# Patient Record
Sex: Male | Born: 1965 | Race: White | Hispanic: No | Marital: Single | Smoking: Current some day smoker
Health system: Southern US, Community
[De-identification: ages and names within clinical notes are randomized; demographics above are authoritative.]

## PROBLEM LIST (undated history)

## (undated) HISTORY — PX: FRACTURE SURGERY: SHX138

---

## 1988-06-12 DIAGNOSIS — I671 Cerebral aneurysm, nonruptured: Secondary | ICD-10-CM | POA: Insufficient documentation

## 1988-06-12 HISTORY — DX: Cerebral aneurysm, nonruptured: I67.1

## 2019-06-18 ENCOUNTER — Encounter: Payer: Self-pay | Admitting: Nurse Practitioner

## 2019-06-18 ENCOUNTER — Emergency Department (HOSPITAL_COMMUNITY)
Admission: EM | Admit: 2019-06-18 | Discharge: 2019-06-18 | Disposition: A | Discharge status: Home / Self Care | Payer: Self-pay | Attending: Emergency Medicine | Admitting: Emergency Medicine

## 2019-06-18 ENCOUNTER — Other Ambulatory Visit: Payer: Self-pay

## 2019-06-18 ENCOUNTER — Telehealth: Payer: Self-pay | Admitting: Nurse Practitioner

## 2019-06-18 ENCOUNTER — Encounter (HOSPITAL_COMMUNITY): Payer: Self-pay | Admitting: Emergency Medicine

## 2019-06-18 ENCOUNTER — Ambulatory Visit (INDEPENDENT_AMBULATORY_CARE_PROVIDER_SITE_OTHER): Payer: Self-pay | Admitting: Nurse Practitioner

## 2019-06-18 ENCOUNTER — Emergency Department (HOSPITAL_COMMUNITY): Payer: Self-pay

## 2019-06-18 VITALS — BP 172/110 | HR 94 | Temp 98.8°F | Wt 120.2 lb

## 2019-06-18 DIAGNOSIS — Z7689 Persons encountering health services in other specified circumstances: Secondary | ICD-10-CM

## 2019-06-18 DIAGNOSIS — Z8679 Personal history of other diseases of the circulatory system: Secondary | ICD-10-CM | POA: Insufficient documentation

## 2019-06-18 DIAGNOSIS — I10 Essential (primary) hypertension: Secondary | ICD-10-CM

## 2019-06-18 DIAGNOSIS — H538 Other visual disturbances: Secondary | ICD-10-CM | POA: Insufficient documentation

## 2019-06-18 DIAGNOSIS — F17210 Nicotine dependence, cigarettes, uncomplicated: Secondary | ICD-10-CM | POA: Insufficient documentation

## 2019-06-18 DIAGNOSIS — Z1211 Encounter for screening for malignant neoplasm of colon: Secondary | ICD-10-CM

## 2019-06-18 DIAGNOSIS — Z1212 Encounter for screening for malignant neoplasm of rectum: Secondary | ICD-10-CM

## 2019-06-18 DIAGNOSIS — Z13220 Encounter for screening for lipoid disorders: Secondary | ICD-10-CM

## 2019-06-18 DIAGNOSIS — Z13228 Encounter for screening for other metabolic disorders: Secondary | ICD-10-CM

## 2019-06-18 DIAGNOSIS — Z125 Encounter for screening for malignant neoplasm of prostate: Secondary | ICD-10-CM

## 2019-06-18 DIAGNOSIS — I671 Cerebral aneurysm, nonruptured: Secondary | ICD-10-CM

## 2019-06-18 DIAGNOSIS — Z23 Encounter for immunization: Secondary | ICD-10-CM

## 2019-06-18 HISTORY — DX: Nicotine dependence, cigarettes, uncomplicated: F17.210

## 2019-06-18 LAB — BASIC METABOLIC PANEL
Anion gap: 10 (ref 5–15)
BUN: 12 mg/dL (ref 6–20)
CO2: 22 mmol/L (ref 22–32)
Calcium: 9.2 mg/dL (ref 8.9–10.3)
Chloride: 105 mmol/L (ref 98–111)
Creatinine, Ser: 0.91 mg/dL (ref 0.61–1.24)
GFR calc Af Amer: >60 mL/min (ref >60)
GFR calc non Af Amer: >60 mL/min (ref >60)
Glucose, Bld: 106 mg/dL — ABNORMAL HIGH (ref 70–99)
Potassium: 3.8 mmol/L (ref 3.5–5.1)
Sodium: 137 mmol/L (ref 135–145)

## 2019-06-18 LAB — CBC
HCT: 41.9 % (ref 39.0–52.0)
Hemoglobin: 13.8 g/dL (ref 13.0–17.0)
MCH: 30.1 pg (ref 26.0–34.0)
MCHC: 32.9 g/dL (ref 30.0–36.0)
MCV: 91.5 fL (ref 80.0–100.0)
Platelets: 249 10*3/uL (ref 150–400)
RBC: 4.58 MIL/uL (ref 4.22–5.81)
RDW: 13 % (ref 11.5–15.5)
WBC: 7.5 10*3/uL (ref 4.0–10.5)
nRBC: 0 % (ref 0.0–0.2)

## 2019-06-18 MED ORDER — LISINOPRIL-HYDROCHLOROTHIAZIDE 20-12.5 MG PO TABS: 1.0000 | ORAL_TABLET | Freq: Every day | ORAL | 3 refills | Status: DC

## 2019-06-18 MED ORDER — NICOTINE 14 MG/24HR TD PT24: 14.0000 mg | MEDICATED_PATCH | Freq: Every day | TRANSDERMAL | 0 refills | Status: DC

## 2019-06-18 NOTE — Telephone Encounter (Signed)
Called report over to [facility] ED informing that we were going to send patient over via personal vehicle for elevated BP that is non responsive to Bystolic 5 MG x 2 in our office. Also advised patient has hx of Brain aneurysm and is current smoker. Advised to send patient over and inform him that there will be a wait in the ED. PCP in office notified
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
## 2019-06-18 NOTE — ED Triage Notes (Addendum)
Patient states he was seen at for check-up today with PCP. States he has had high BP for past month and was seen for this today. Currently not on any BP medications but was prescribed some today (does not recall name of meds).   Does not recall what BP was at appointment today. Was told by PCP to come to ED to be seen since BP would not come down.

## 2019-06-18 NOTE — Discharge Instructions (Addendum)
You were evaluated in the Emergency Department and after careful evaluation, we did not find any emergent condition requiring admission or further testing in the hospital.  Your exam/testing today was overall reassuring.  Please continue follow-up with your primary care doctor and take the blood pressure medication as directed.  Please return to the Emergency Department if you experience any worsening of your condition.  We encourage you to follow up with a primary care provider.  Thank you for allowing [place] to be a part of your care.

## 2019-06-18 NOTE — Progress Notes (Signed)
New Patient Office Visit  Subjective:  Patient ID: Leonard Tran, male    DOB: January 18, 1966  Age: 54 y.o. MRN: [id number]  CC:  Chief Complaint  Patient presents with  . 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 He has not seen a doctor in over 10 years. He is not on or have been on any treatment for HTN He has smoked for at least 30 years 1 to 1.5 pack a day of cigarettes.   His health and medication history reviewed and discussed. Encouraged healthy lifestyle to include healthy eating and exercise. Dicussed labs for screening of Prostate, lipids, and metabolic. Pt has not completed cscope and desires after discussion of risk vs benefit.   Tdap desired and administered without reaction in 15 minutes.  Bystolic 5 mg administered in clinic two times (what is available in clinic) without reducing Blood pressure or heart rate. EKG completed NSR and advised pt to go to ER r/t elevated blood pressure, h/o brain aneurysm in a smoker. I did prescribed Lisinopril/hctz for pt to start however in lieu of resistance and stated ER is advised. [facility] LPN called report to ER. Pt reports uninsured and desires to drive. He is stable, a/ox4, no dizzyness, vertigo, cp/ct, visual changes. Advised to pull over and call 911 if sxs occur in route to ER.   Past Medical History:  Diagnosis Date  . Brain aneurysm 06/12/1988  . Cigarette smoker one half pack a day or less 06/18/2019   No family history on file.  Social History   Socioeconomic History  . Marital status: Single    Spouse name: Not on file  . Number of children: Not on file  . Years of education: Not on file  . Highest education level: Not on file  Occupational History  . Not on file  Tobacco Use  . Smoking status: Current Some Day Smoker  . Smokeless tobacco: Never  Used  Substance and Sexual Activity  . Alcohol use: Yes    Comment: twice a year  . Drug use: Never  . Sexual activity: Not Currently  Other Topics Concern  . Not on file  Social History Narrative  . Not on file   Social Determinants of Health   Financial Resource Strain:   . Difficulty of Paying Living Expenses:   Food Insecurity:   . Worried About Programme researcher, broadcasting/film/video in the Last Year:   . Barista in the Last Year:   Transportation Needs:   . Freight forwarder (Medical):   Marland Kitchen Lack of Transportation (Non-Medical):   Physical Activity:   . Days of Exercise per Week:   . Minutes of Exercise per Session:   Stress:   . Feeling of Stress :   Social Connections:   . Frequency of Communication with Friends and Family:   . Frequency of Social Gatherings with Friends and Family:   . Attends Religious Services:   . Active Member of Clubs or Organizations:   . Attends Banker Meetings:   Marland Kitchen Marital Status:   Intimate Partner Violence:   . Fear of Current or Ex-Partner:   . Emotionally Abused:   Marland Kitchen Physically Abused:   . Sexually Abused:     ROS Review of Systems  All other systems  reviewed and are negative.   Objective:   Today's Vitals: BP (!) 172/110 (BP Location: Left Arm, Patient Position: Sitting, Cuff Size: Normal)   Pulse 94   Temp 98.8 F (37.1 C) (Temporal)   Wt 120 lb 3.2 oz (54.5 kg)   SpO2 96%   Physical Exam Vitals and nursing note reviewed.  Constitutional:      Appearance: Normal appearance.  HENT:     Head: Normocephalic.     Right Ear: Tympanic membrane, ear canal and external ear normal.     Left Ear: Tympanic membrane, ear canal and external ear normal.     Nose: Nose normal.     Mouth/Throat:     Mouth: Mucous membranes are moist.     Pharynx: Oropharynx is clear.  Eyes:     Extraocular Movements: Extraocular movements intact.     Conjunctiva/sclera: Conjunctivae normal.     Pupils: Pupils are equal, round, and  reactive to light.  Neck:     Thyroid: No thyromegaly or thyroid tenderness.     Vascular: No carotid bruit or JVD.  Cardiovascular:     Rate and Rhythm: Normal rate and regular rhythm.     Pulses: Normal pulses.     Heart sounds: Normal heart sounds.  Pulmonary:     Effort: Pulmonary effort is normal.     Breath sounds: Normal breath sounds.  Abdominal:     General: There is no abdominal bruit.     Palpations: There is no hepatomegaly or splenomegaly.  Musculoskeletal:     Cervical back: Normal range of motion and neck supple.     Right lower leg: No edema.     Left lower leg: No edema.  Lymphadenopathy:     Cervical: No cervical adenopathy.  Skin:    General: Skin is warm and dry.     Capillary Refill: Capillary refill takes less than 2 seconds.  Neurological:     General: No focal deficit present.     Mental Status: He is alert and oriented to person, place, and time.  Psychiatric:        Attention and Perception: Attention normal.        Mood and Affect: Mood normal.        Speech: Speech normal.        Behavior: Behavior normal. Behavior is cooperative.        Cognition and Memory: Cognition normal.        Judgment: Judgment normal.     Assessment & Plan:  You have established care Your blood pressure is elevated: discussed risk and benefits of treatment, prescribed medication, start taking today and follow up in 1 week. Take blood pressure at home 3 hours after AM dose of medication and bring to your next appointment. You where given Tdap vaccine today  Print out information/education discussed and provided for Tdap, healthy exercise and healthy diet, hypertension, health risks of smoking, and colorectal screening Labs today for PSA, CBC, CMP, Lipid the results will be called to you with instructions and follow up plan  based on results GI referreral for colonoscopy-colorectal cancer screen Smoking cessation: nicotine patches every 24 hours, do not smoke while using  patch. We will plan to reduce the patch dose on next refill.  Go to ER for elevated blood pressure.He is stable, a/ox4, no dizzyness, vertigo, cp/ct, visual changes. Advised to pull over and call 911 if sxs occur in route to ER.   Follow up in 1 week for HTN  blood pressure recheck.     Outpatient Encounter Medications as of 06/18/2019  Medication Sig  . ibuprofen (ADVIL) 100 MG tablet Take 100 mg by mouth every 6 (six) hours as needed for fever.  Marland Kitchen lisinopril-hydrochlorothiazide (ZESTORETIC) 20-12.5 MG tablet Take 1 tablet by mouth daily.  . nicotine (NICODERM CQ - DOSED IN MG/24 HOURS) 14 mg/24hr patch Place 1 patch (14 mg total) onto the skin daily.   No facility-administered encounter medications on file as of 06/18/2019.    Follow-up: Return in about 1 day (around 06/19/2019).   Elmore Guise, FNP

## 2019-06-23 ENCOUNTER — Ambulatory Visit (INDEPENDENT_AMBULATORY_CARE_PROVIDER_SITE_OTHER): Payer: Self-pay | Admitting: Nurse Practitioner

## 2019-06-23 ENCOUNTER — Encounter: Payer: Self-pay | Admitting: Nurse Practitioner

## 2019-06-23 ENCOUNTER — Other Ambulatory Visit: Payer: Self-pay

## 2019-06-23 VITALS — BP 140/98 | HR 88 | Temp 98.0°F | Resp 16 | Ht 72.0 in | Wt 118.5 lb

## 2019-06-23 DIAGNOSIS — Z09 Encounter for follow-up examination after completed treatment for conditions other than malignant neoplasm: Secondary | ICD-10-CM

## 2019-06-23 DIAGNOSIS — Z1212 Encounter for screening for malignant neoplasm of rectum: Secondary | ICD-10-CM

## 2019-06-23 DIAGNOSIS — Z1211 Encounter for screening for malignant neoplasm of colon: Secondary | ICD-10-CM

## 2019-06-23 DIAGNOSIS — F17200 Nicotine dependence, unspecified, uncomplicated: Secondary | ICD-10-CM

## 2019-06-23 DIAGNOSIS — F17209 Nicotine dependence, unspecified, with unspecified nicotine-induced disorders: Secondary | ICD-10-CM

## 2019-06-23 DIAGNOSIS — Z720 Tobacco use: Secondary | ICD-10-CM

## 2019-06-23 DIAGNOSIS — Z13220 Encounter for screening for lipoid disorders: Secondary | ICD-10-CM

## 2019-06-23 DIAGNOSIS — Z716 Tobacco abuse counseling: Secondary | ICD-10-CM

## 2019-06-23 DIAGNOSIS — R7309 Other abnormal glucose: Secondary | ICD-10-CM

## 2019-06-23 DIAGNOSIS — Z0001 Encounter for general adult medical examination with abnormal findings: Secondary | ICD-10-CM

## 2019-06-23 DIAGNOSIS — I10 Essential (primary) hypertension: Secondary | ICD-10-CM

## 2019-06-23 DIAGNOSIS — Z125 Encounter for screening for malignant neoplasm of prostate: Secondary | ICD-10-CM

## 2019-06-23 NOTE — Patient Instructions (Signed)
Monitor home Blood Pressure Make appt with PCP for Blood Pressure readings 3 hours after taking medication if greater than 140/90. Start nicotine patches to stop smoking Exercise and eat low cholesterol, low carb, low sugar, low fat diet Follow up in 6 months Labs today nursing will call with report and directions

## 2019-06-23 NOTE — Progress Notes (Signed)
Established Patient Office Visit  Subjective:  Patient ID: Leonard Tran, male    DOB: [birth year]  Age: 54 y.o. MRN: [id number]  CC:  Chief Complaint  Patient presents with  . Hypertension    HPI Leonard Tran is a 54 year old male presenting for ED follow up for HTN. He had established care last week with this pclinic and was found to have uncontrolled HTN, smoker, and hx of brain aneurysm. HTN medication prescriptions where sent to his pharmacy that day. Pt has started taking his prescribed HTN medication. He denied cp/ct, gu/gi sxs, pain, edema, sob, or recent falls.   Took RX'd blood pressure medication 1 hour prior to the blood pressure reading. Education provided/discussed and printed for home blood pressure readings 3 hours after medication and goal readings <140/90.   Time spent counseling on smoking cessation, already prescribed patch and pt compelled and plans to start. He will follow up when his script runs out to consider and plan to reduce dose of nicotine.   CBC and CMP from ER unremarkable except elevated confirmed with pt fasting glucose. A1C Lipid panel and PSA to be drawn today and called by nsg with results and directions.   Past Medical History:  Diagnosis Date  . Brain aneurysm 06/12/1988  . Cigarette smoker one half pack a day or less 06/18/2019    No past surgical history on file.  No family history on file.  Social History   Socioeconomic History  . Marital status: Single    Spouse name: Not on file  . Number of children: Not on file  . Years of education: Not on file  . Highest education level: Not on file  Occupational History  . Not on file  Tobacco Use  . Smoking status: Current Some Day Smoker  . Smokeless tobacco: Never Used  Substance and Sexual Activity  . Alcohol use: Yes    Comment: twice a year  . Drug use: Never  . Sexual activity: Not Currently  Other Topics Concern  . Not on file  Social History Narrative  . Not on file   Social  Determinants of Health   Financial Resource Strain:   . Difficulty of Paying Living Expenses:   Food Insecurity:   . Worried About Charity fundraiser in the Last Year:   . Arboriculturist in the Last Year:   Transportation Needs:   . Film/video editor (Medical):   Marland Kitchen Lack of Transportation (Non-Medical):   Physical Activity:   . Days of Exercise per Week:   . Minutes of Exercise per Session:   Stress:   . Feeling of Stress :   Social Connections:   . Frequency of Communication with Friends and Family:   . Frequency of Social Gatherings with Friends and Family:   . Attends Religious Services:   . Active Member of Clubs or Organizations:   . Attends Archivist Meetings:   Marland Kitchen Marital Status:   Intimate Partner Violence:   . Fear of Current or Ex-Partner:   . Emotionally Abused:   Marland Kitchen Physically Abused:   . Sexually Abused:     Outpatient Medications Prior to Visit  Medication Sig Dispense Refill  . ibuprofen (ADVIL) 100 MG tablet Take 100 mg by mouth every 6 (six) hours as needed for fever.    Marland Kitchen lisinopril-hydrochlorothiazide (ZESTORETIC) 20-12.5 MG tablet Take 1 tablet by mouth daily. 90 tablet 3  . nicotine (NICODERM CQ - DOSED IN MG/24 HOURS)  14 mg/24hr patch Place 1 patch (14 mg total) onto the skin daily. (Patient not taking: Reported on 06/23/2019) 30 patch 0   No facility-administered medications prior to visit.    Allergies  Allergen Reactions  . Chantix [Varenicline] Rash    ROS Review of Systems  All other systems reviewed and are negative.     Objective:    Physical Exam  Constitutional: He is oriented to person, place, and time. Vital signs are normal. He appears well-developed and well-nourished. He does not appear ill.  HENT:  Head: Normocephalic.  Eyes: Pupils are equal, round, and reactive to light. Conjunctivae and EOM are normal.  Neck: No JVD present. Carotid bruit is not present. No thyromegaly present.  Cardiovascular: Normal rate,  regular rhythm and normal heart sounds.  Pulmonary/Chest: Effort normal and breath sounds normal.  Abdominal: Soft. Bowel sounds are normal. He exhibits no abdominal bruit.  Musculoskeletal:        General: Normal range of motion.     Cervical back: Normal range of motion and neck supple.  Lymphadenopathy:    He has no cervical adenopathy.  Neurological: He is alert and oriented to person, place, and time. He has normal reflexes.  Skin: Skin is warm and dry.  Psychiatric: He has a normal mood and affect. His behavior is normal. Judgment and thought content normal.  Nursing note and vitals reviewed.   BP (!) 140/98   Pulse 88   Temp 98 F (36.7 C) (Oral)   Resp 16   Ht 6' (1.829 m)   Wt 118 lb 8 oz (53.8 kg)   SpO2 98%   BMI 16.07 kg/m  Wt Readings from Last 3 Encounters:  06/23/19 118 lb 8 oz (53.8 kg)  06/18/19 120 lb (54.4 kg)  06/18/19 120 lb 3.2 oz (54.5 kg)     Health Maintenance Due  Topic Date Due  . HIV Screening  Never done  . COLONOSCOPY  Never done    There are no preventive care reminders to display for this patient.  No results found for: TSH Lab Results  Component Value Date   WBC 7.5 06/18/2019   HGB 13.8 06/18/2019   HCT 41.9 06/18/2019   MCV 91.5 06/18/2019   PLT 249 06/18/2019   Lab Results  Component Value Date   NA 137 06/18/2019   K 3.8 06/18/2019   CO2 22 06/18/2019   GLUCOSE 106 (H) 06/18/2019   BUN 12 06/18/2019   CREATININE 0.91 06/18/2019   CALCIUM 9.2 06/18/2019   ANIONGAP 10 06/18/2019     Assessment & Plan:  Follow Up today after sent to ER for HTN. Stable BP Continue to take prescribed medication taking home blood pressure and reporting/seeking medical attention for >140/90.  Education al print out for smoking cessation, Blood Pressure management, Healthy eating and exercise.  Lab results to be called to pt by nsg with follow up plan and directions related to lipid screen, a1c after ER elevated fasting glucose, and PSA  screen.  Cologuard ordered for colorectal screen, pt is asymptomatic.   Problem List Items Addressed This Visit    None    Visit Diagnoses    Hypertension, unspecified type    -  Primary   Follow-up exam after treatment       Lipid screening       Relevant Orders   Lipid panel   Prostate cancer screening       Relevant Orders   PSA   Elevated glucose  Relevant Orders   Hemoglobin A1c   Tobacco use disorder, continuous       Tobacco abuse counseling       Smoking addiction       Relevant Orders   Ambulatory referral to Smoking Cessation Program   Encounter for smoking cessation counseling       Tobacco abuse          Follow-up: Return in about 6 months (around 12/23/2019) for labs one week prior to apt.    Elmore Guise, FNP

## 2019-06-24 LAB — HEMOGLOBIN A1C
Hgb A1c MFr Bld: 5.4 % of total Hgb (ref <5.7)
Mean Plasma Glucose: 108 (calc)
eAG (mmol/L): 6 (calc)

## 2019-06-24 LAB — LIPID PANEL
Cholesterol: 182 mg/dL (ref <200)
HDL: 44 mg/dL (ref >=40)
LDL Cholesterol (Calc): 122 mg/dL (calc) — ABNORMAL HIGH
Non-HDL Cholesterol (Calc): 138 mg/dL (calc) — ABNORMAL HIGH (ref <130)
Total CHOL/HDL Ratio: 4.1 (calc) (ref <5.0)
Triglycerides: 66 mg/dL (ref <150)

## 2019-06-24 LAB — PSA: PSA: 3 ng/mL (ref <=4.0)

## 2019-07-09 ENCOUNTER — Encounter: Payer: Self-pay | Admitting: Nurse Practitioner

## 2019-07-09 ENCOUNTER — Other Ambulatory Visit: Payer: Self-pay | Admitting: Nurse Practitioner

## 2019-07-09 ENCOUNTER — Telehealth: Payer: Self-pay | Admitting: Nurse Practitioner

## 2019-07-09 DIAGNOSIS — I10 Essential (primary) hypertension: Secondary | ICD-10-CM

## 2019-07-09 DIAGNOSIS — R7309 Other abnormal glucose: Secondary | ICD-10-CM | POA: Insufficient documentation

## 2019-07-09 HISTORY — DX: Essential (primary) hypertension: I10

## 2019-07-09 NOTE — Telephone Encounter (Signed)
[facility] 

## 2019-07-09 NOTE — Telephone Encounter (Signed)
[phone] or [phone] Crystal put patient on bp medication now is making him dizzy and like he is going to fall out

## 2019-07-09 NOTE — Telephone Encounter (Signed)
See note

## 2019-07-09 NOTE — Telephone Encounter (Signed)
Please have him make an appointment and bring blood pressure readings from home.

## 2019-07-14 ENCOUNTER — Ambulatory Visit: Payer: Self-pay | Admitting: Nurse Practitioner

## 2019-08-19 ENCOUNTER — Encounter: Payer: Self-pay | Admitting: Nurse Practitioner

## 2019-12-23 ENCOUNTER — Ambulatory Visit: Payer: Self-pay | Admitting: Nurse Practitioner

## 2020-06-17 ENCOUNTER — Other Ambulatory Visit: Payer: Self-pay | Admitting: *Deleted

## 2020-07-14 ENCOUNTER — Other Ambulatory Visit: Payer: Self-pay

## 2020-07-14 DIAGNOSIS — Z125 Encounter for screening for malignant neoplasm of prostate: Secondary | ICD-10-CM

## 2020-07-14 DIAGNOSIS — I10 Essential (primary) hypertension: Secondary | ICD-10-CM

## 2020-07-14 MED ORDER — LISINOPRIL-HYDROCHLOROTHIAZIDE 20-12.5 MG PO TABS: 1.0000 | ORAL_TABLET | Freq: Every day | ORAL | 0 refills | Status: DC

## 2020-07-22 ENCOUNTER — Encounter: Payer: Self-pay | Admitting: Family Medicine

## 2020-07-22 ENCOUNTER — Other Ambulatory Visit: Payer: Self-pay

## 2020-07-22 ENCOUNTER — Ambulatory Visit (INDEPENDENT_AMBULATORY_CARE_PROVIDER_SITE_OTHER): Payer: Self-pay | Admitting: Family Medicine

## 2020-07-22 VITALS — BP 148/86 | HR 82 | Temp 98.2°F | Resp 14 | Ht 72.0 in | Wt 113.0 lb

## 2020-07-22 DIAGNOSIS — I671 Cerebral aneurysm, nonruptured: Secondary | ICD-10-CM

## 2020-07-22 DIAGNOSIS — I10 Essential (primary) hypertension: Secondary | ICD-10-CM

## 2020-07-22 DIAGNOSIS — Z720 Tobacco use: Secondary | ICD-10-CM

## 2020-07-22 MED ORDER — LOSARTAN POTASSIUM-HCTZ 50-12.5 MG PO TABS: 1.0000 | ORAL_TABLET | Freq: Every day | ORAL | 5 refills | Status: DC
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
## 2020-07-23 LAB — BASIC METABOLIC PANEL WITH GFR
BUN: 14 mg/dL (ref 7–25)
CO2: 28 mmol/L (ref 20–32)
Calcium: 9.5 mg/dL (ref 8.6–10.3)
Chloride: 101 mmol/L (ref 98–110)
Creat: 0.91 mg/dL (ref 0.70–1.33)
GFR, Est African American: 110 mL/min/{1.73_m2} (ref >=60)
GFR, Est Non African American: 95 mL/min/{1.73_m2} (ref >=60)
Glucose, Bld: 90 mg/dL (ref 65–99)
Potassium: 3.8 mmol/L (ref 3.5–5.3)
Sodium: 136 mmol/L (ref 135–146)

## 2020-08-11 ENCOUNTER — Other Ambulatory Visit: Payer: Self-pay | Admitting: Family Medicine

## 2020-08-11 DIAGNOSIS — I10 Essential (primary) hypertension: Secondary | ICD-10-CM

## 2020-09-03 ENCOUNTER — Other Ambulatory Visit: Payer: Self-pay | Admitting: *Deleted

## 2020-09-03 DIAGNOSIS — I10 Essential (primary) hypertension: Secondary | ICD-10-CM

## 2020-09-03 MED ORDER — LISINOPRIL-HYDROCHLOROTHIAZIDE 20-12.5 MG PO TABS: ORAL_TABLET | ORAL | 1 refills | Status: DC

## 2021-01-31 ENCOUNTER — Other Ambulatory Visit: Payer: Self-pay | Admitting: Family Medicine

## 2021-08-17 ENCOUNTER — Other Ambulatory Visit: Payer: Self-pay | Admitting: Family Medicine

## 2021-08-17 NOTE — Telephone Encounter (Signed)
Requested medication (s) are due for refill today: yes  Requested medication (s) are on the active medication list:yes  Last refill:  01/31/21  Future visit scheduled: no  Notes to clinic:  Unable to refill per protocol, appointment needed.  Called pt, no answer. Left VM to call back to schedule OV for medication refills.     Requested Prescriptions  Pending Prescriptions Disp Refills   losartan-hydrochlorothiazide (HYZAAR) 50-12.5 MG tablet [Pharmacy Med Name: LOSARTAN-HCTZ 50-12.5 MG TAB] 90 tablet 1    Sig: TAKE 1 TABLET BY MOUTH EVERY DAY     Cardiovascular: ARB + Diuretic Combos Failed - 08/17/2021  3:33 AM      Failed - K in normal range and within 180 days    Potassium  Date Value Ref Range Status  07/22/2020 3.8 3.5 - 5.3 mmol/L Final         Failed - Na in normal range and within 180 days    Sodium  Date Value Ref Range Status  07/22/2020 136 135 - 146 mmol/L Final         Failed - Cr in normal range and within 180 days    Creat  Date Value Ref Range Status  07/22/2020 0.91 0.70 - 1.33 mg/dL Final    Comment:    For patients >49 years of age, the reference limit for Creatinine is approximately 13% higher for people identified as African-American. .          Failed - eGFR is 10 or above and within 180 days    GFR, Est African American  Date Value Ref Range Status  07/22/2020 110 > OR = 60 mL/min/1.73m2 Final   GFR, Est Non African American  Date Value Ref Range Status  07/22/2020 95 > OR = 60 mL/min/1.73m2 Final         Failed - Last BP in normal range    BP Readings from Last 1 Encounters:  07/22/20 (!) 148/86         Failed - Valid encounter within last 6 months    Recent Outpatient Visits           1 year ago Tobacco abuse   [facility] Pickard, Warren T, MD   2 years ago Hypertension, unspecified type   [facility] Bates, Crystal A, FNP   2 years ago Screening, lipid   [facility] Bates,  Crystal A, FNP               Passed - Patient is not pregnant        

## 2021-09-11 ENCOUNTER — Other Ambulatory Visit: Payer: Self-pay | Admitting: Family Medicine

## 2021-09-12 NOTE — Telephone Encounter (Signed)
Called pt - LMOM to return call for OV.

## 2021-09-12 NOTE — Telephone Encounter (Signed)
Requested medications are due for refill today.  yes  Requested medications are on the active medications list.  yes  Last refill. 08/17/2021 #30   Future visit scheduled.   no  Notes to clinic.  Pt is more than 3 months overdue for OV.    Requested Prescriptions  Pending Prescriptions Disp Refills   losartan-hydrochlorothiazide (HYZAAR) 50-12.5 MG tablet [Pharmacy Med Name: LOSARTAN-HCTZ 50-12.5 MG TAB] 30 tablet 0    Sig: TAKE 1 TABLET BY MOUTH EVERY DAY     Cardiovascular: ARB + Diuretic Combos Failed - 09/11/2021  1:31 PM      Failed - K in normal range and within 180 days    Potassium  Date Value Ref Range Status  07/22/2020 3.8 3.5 - 5.3 mmol/L Final         Failed - Na in normal range and within 180 days    Sodium  Date Value Ref Range Status  07/22/2020 136 135 - 146 mmol/L Final         Failed - Cr in normal range and within 180 days    Creat  Date Value Ref Range Status  07/22/2020 0.91 0.70 - 1.33 mg/dL Final    Comment:    For patients >49 years of age, the reference limit for Creatinine is approximately 13% higher for people identified as African-American. .          Failed - eGFR is 10 or above and within 180 days    GFR, Est African American  Date Value Ref Range Status  07/22/2020 110 > OR = 60 mL/min/1.73m2 Final   GFR, Est Non African American  Date Value Ref Range Status  07/22/2020 95 > OR = 60 mL/min/1.73m2 Final         Failed - Last BP in normal range    BP Readings from Last 1 Encounters:  07/22/20 (!) 148/86         Failed - Valid encounter within last 6 months    Recent Outpatient Visits           1 year ago Tobacco abuse   [facility] Pickard, Warren T, MD   2 years ago Hypertension, unspecified type   [facility] Bates, Crystal A, FNP   2 years ago Screening, lipid   [facility] Bates, Crystal A, FNP              Passed - Patient is not pregnant        

## 2021-11-29 ENCOUNTER — Ambulatory Visit (INDEPENDENT_AMBULATORY_CARE_PROVIDER_SITE_OTHER): Payer: Self-pay | Admitting: Family Medicine

## 2021-11-29 VITALS — BP 160/110 | HR 88 | Temp 97.8°F | Wt 116.2 lb

## 2021-11-29 DIAGNOSIS — R634 Abnormal weight loss: Secondary | ICD-10-CM

## 2021-11-29 NOTE — Progress Notes (Signed)
Subjective:    Patient ID: Leonard Mons., male    DOB: [birth year], 56 y.o.   MRN: [id number]  HPI  Patient is a 56 year old Caucasian gentleman with a history of hypertension who presents today with unintentional weight loss. Wt Readings from Last 3 Encounters:  11/29/21 116 lb 3.2 oz (52.7 kg)  07/22/20 113 lb (51.3 kg)  06/23/19 118 lb 8 oz (53.8 kg)   Patient states that he has lost more than 10 pounds.  By my scales he is actually 3 pounds heavier than he was last year.  He denies any chest pain or shortness of breath hemoptysis.  He does have a 20+ pack year history of smoking.  He denies any abdominal pain nausea vomiting melena hematochezia.  He is overdue for colon cancer screening.  He recently had a DOT physical and the doctor was concerned about a dysplastic mole.  On his back there is a mole roughly 5 mm in diameter.  It is well-circumscribed.  Its monotone in color.  It is dark brown.  We discussed a biopsy but the patient declined that today.  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  Past Medical History:  Diagnosis Date   Brain aneurysm 06/12/1988   Cigarette smoker one half pack a day or less 06/18/2019   HTN (hypertension) 07/09/2019   Past Surgical History:  Procedure Laterality Date   FRACTURE SURGERY Right    arm   Current Outpatient Medications on File Prior to Visit  Medication Sig Dispense Refill   ibuprofen (ADVIL) 100 MG tablet Take 100 mg by mouth every 6 (six) hours as needed for fever.     losartan-hydrochlorothiazide (HYZAAR) 50-12.5 MG tablet TAKE 1 TABLET BY MOUTH EVERY DAY 30 tablet 0   No current facility-administered medications on file prior to visit.   Allergies  Allergen Reactions   Chantix [Varenicline] Rash   Social History   Socioeconomic History   Marital status: Single    Spouse name: Not on file   Number of children: 1   Years of education: Not on file   Highest education level: Not on file   Occupational History   Occupation: Cabin crew  Tobacco Use   Smoking status: Some Days    Packs/day: 1.50    Years: 35.00    Total pack years: 52.50    Types: Cigarettes   Smokeless tobacco: Never  Vaping Use   Vaping Use: Never used  Substance and Sexual Activity   Alcohol use: Yes    Comment: twice a year   Drug use: Yes    Types: Marijuana   Sexual activity: Not Currently  Other Topics Concern   Not on file  Social History Narrative   Not on file   Social Determinants of Health   Financial Resource Strain: Not on file  Food Insecurity: Not on file  Transportation Needs: Not on file  Physical Activity: Not on file  Stress: Not on file  Social Connections: Not on file  Intimate Partner Violence: Not on file   Family History  Problem Relation Age of Onset   Hypertension Father    Alcohol abuse Maternal Grandmother    Alcohol abuse Maternal Grandfather    ALS Paternal Grandmother    Alcohol abuse Paternal Grandfather    Cancer Paternal Grandfather        esophagus   Hearing loss Paternal Grandfather      Review of Systems  All other  systems reviewed and are negative.      Objective:   Physical Exam Vitals reviewed.  Constitutional:      General: He is not in acute distress.    Appearance: Normal appearance. He is normal weight. He is not ill-appearing, toxic-appearing or diaphoretic.  HENT:     Head: Normocephalic and atraumatic.     Right Ear: Tympanic membrane and ear canal normal. There is no impacted cerumen.     Left Ear: Tympanic membrane and ear canal normal. There is no impacted cerumen.     Nose: Nose normal. No congestion or rhinorrhea.     Mouth/Throat:     Mouth: Mucous membranes are moist.     Pharynx: Oropharynx is clear. No oropharyngeal exudate or posterior oropharyngeal erythema.  Eyes:     General: No scleral icterus.       Right eye: No discharge.        Left eye: No discharge.     Extraocular Movements: Extraocular movements  intact.     Conjunctiva/sclera: Conjunctivae normal.     Pupils: Pupils are equal, round, and reactive to light.  Neck:     Vascular: No carotid bruit.  Cardiovascular:     Rate and Rhythm: Normal rate and regular rhythm.     Pulses: Normal pulses.     Heart sounds: Normal heart sounds. No murmur heard.    No friction rub. No gallop.  Pulmonary:     Effort: Pulmonary effort is normal. No respiratory distress.     Breath sounds: Normal breath sounds. No wheezing, rhonchi or rales.  Chest:     Chest wall: No tenderness.  Abdominal:     General: Abdomen is flat. Bowel sounds are normal. There is no distension.     Palpations: Abdomen is soft. There is no mass.     Tenderness: There is no abdominal tenderness. There is no guarding or rebound.     Hernia: No hernia is present.  Musculoskeletal:     Cervical back: Normal range of motion and neck supple. No rigidity.     Right lower leg: No edema.     Left lower leg: No edema.  Lymphadenopathy:     Cervical: No cervical adenopathy.  Skin:    General: Skin is warm.     Coloration: Skin is not jaundiced or pale.     Findings: No bruising, erythema, lesion or rash.  Neurological:     General: No focal deficit present.     Mental Status: He is alert and oriented to person, place, and time. Mental status is at baseline.     Cranial Nerves: No cranial nerve deficit.     Sensory: No sensory deficit.     Motor: No weakness.     Coordination: Coordination normal.     Gait: Gait normal.     Deep Tendon Reflexes: Reflexes normal.  Psychiatric:        Mood and Affect: Mood normal.        Behavior: Behavior normal.        Thought Content: Thought content normal.        Judgment: Judgment normal.           Assessment & Plan:  Weight loss - Plan: CBC with Differential/Platelet, Lipid panel, COMPLETE METABOLIC PANEL WITH GFR, PSA, TSH Given the weight loss, I will check a CBC and CMP lipid panel PSA and TSH.  If labs are normal, I would  attribute any weight loss most likely due  to sweating working in the heat and the patient also admits that he has been eating less recently.  If the labs are abnormal, I would like to pursue that further.  I did recommend a CT scan of the lungs and a colonoscopy for cancer screening that is overdue but the patient defers these at the present time due to cost.  His blood pressure here today is elevated however he has whitecoat syndrome.  His blood pressure at home is typically 120/70 and he monitors this on a daily basis.

## 2021-11-30 LAB — PSA: PSA: 1.31 ng/mL (ref <=4.00)

## 2021-11-30 LAB — CBC WITH DIFFERENTIAL/PLATELET
Absolute Monocytes: 905 cells/uL (ref 200–950)
Basophils Absolute: 86 cells/uL (ref 0–200)
Basophils Relative: 1.1 %
Eosinophils Absolute: 86 cells/uL (ref 15–500)
Eosinophils Relative: 1.1 %
HCT: 45 % (ref 38.5–50.0)
Hemoglobin: 15 g/dL (ref 13.2–17.1)
Lymphs Abs: 2090 cells/uL (ref 850–3900)
MCH: 31.3 pg (ref 27.0–33.0)
MCHC: 33.3 g/dL (ref 32.0–36.0)
MCV: 93.9 fL (ref 80.0–100.0)
MPV: 10.2 fL (ref 7.5–12.5)
Monocytes Relative: 11.6 %
Neutro Abs: 4633 cells/uL (ref 1500–7800)
Neutrophils Relative %: 59.4 %
Platelets: 234 10*3/uL (ref 140–400)
RBC: 4.79 10*6/uL (ref 4.20–5.80)
RDW: 12.8 % (ref 11.0–15.0)
Total Lymphocyte: 26.8 %
WBC: 7.8 10*3/uL (ref 3.8–10.8)

## 2021-11-30 LAB — COMPLETE METABOLIC PANEL WITH GFR
AG Ratio: 1 (calc) (ref 1.0–2.5)
ALT: 19 U/L (ref 9–46)
AST: 19 U/L (ref 10–35)
Albumin: 4.1 g/dL (ref 3.6–5.1)
Alkaline phosphatase (APISO): 71 U/L (ref 35–144)
BUN: 12 mg/dL (ref 7–25)
CO2: 29 mmol/L (ref 20–32)
Calcium: 9.9 mg/dL (ref 8.6–10.3)
Chloride: 105 mmol/L (ref 98–110)
Creat: 0.98 mg/dL (ref 0.70–1.30)
Globulin: 4.1 g/dL (calc) — ABNORMAL HIGH (ref 1.9–3.7)
Glucose, Bld: 60 mg/dL — ABNORMAL LOW (ref 65–99)
Potassium: 4 mmol/L (ref 3.5–5.3)
Sodium: 141 mmol/L (ref 135–146)
Total Bilirubin: 0.4 mg/dL (ref 0.2–1.2)
Total Protein: 8.2 g/dL — ABNORMAL HIGH (ref 6.1–8.1)
eGFR: 91 mL/min/{1.73_m2} (ref >=60)

## 2021-11-30 LAB — LIPID PANEL
Cholesterol: 186 mg/dL (ref <200)
HDL: 48 mg/dL (ref >=40)
LDL Cholesterol (Calc): 121 mg/dL (calc) — ABNORMAL HIGH
Non-HDL Cholesterol (Calc): 138 mg/dL (calc) — ABNORMAL HIGH (ref <130)
Total CHOL/HDL Ratio: 3.9 (calc) (ref <5.0)
Triglycerides: 77 mg/dL (ref <150)

## 2021-11-30 LAB — TSH: TSH: 0.82 mIU/L (ref 0.40–4.50)

## 2021-12-01 ENCOUNTER — Other Ambulatory Visit: Payer: Self-pay | Admitting: Family Medicine

## 2021-12-25 ENCOUNTER — Other Ambulatory Visit: Payer: Self-pay | Admitting: Family Medicine

## 2021-12-26 NOTE — Telephone Encounter (Signed)
Requested Prescriptions  Pending Prescriptions Disp Refills  . losartan-hydrochlorothiazide (HYZAAR) 50-12.5 MG tablet [Pharmacy Med Name: LOSARTAN-HCTZ 50-12.5 MG TAB] 90 tablet 1    Sig: TAKE 1 TABLET BY MOUTH EVERY DAY     Cardiovascular: ARB + Diuretic Combos Failed - 12/25/2021  1:33 PM      Failed - Last BP in normal range    BP Readings from Last 1 Encounters:  11/29/21 (!) 160/110         Failed - Valid encounter within last 6 months    Recent Outpatient Visits          1 year ago Tobacco abuse   [place] Susy Frizzle, MD   2 years ago Hypertension, unspecified type   [facility] [place], [place]   2 years ago Screening, lipid   [place] [place], [place]      Future Appointments            In 5 months Pickard, Cammie Mcgee, MD [place]           Passed - K in normal range and within 180 days    Potassium  Date Value Ref Range Status  11/29/2021 4.0 3.5 - 5.3 mmol/L Final         Passed - Na in normal range and within 180 days    Sodium  Date Value Ref Range Status  11/29/2021 141 135 - 146 mmol/L Final         Passed - Cr in normal range and within 180 days    Creat  Date Value Ref Range Status  11/29/2021 0.98 0.70 - 1.30 mg/dL Final         Passed - eGFR is 10 or above and within 180 days    GFR, Est African American  Date Value Ref Range Status  07/22/2020 110 > OR = 60 mL/min/1.58m Final   GFR, Est Non African American  Date Value Ref Range Status  07/22/2020 95 > OR = 60 mL/min/1.722mFinal   eGFR  Date Value Ref Range Status  11/29/2021 91 > OR = 60 mL/min/1.7368minal         Passed - Patient is not pregnant

## 2022-01-11 IMAGING — CT CT HEAD W/O CM
3 series · 16 of 47 positions shown, 19 images · non-contrast
Comparison: None.

CLINICAL DATA: HTN, headache, aneurysm repair.

EXAM:
CT HEAD WITHOUT CONTRAST
TECHNIQUE: Contiguous axial images were obtained from the base of the skull
through the vertex without intravenous contrast.

[Series 2: head wo · axial · 0.41mm/px · z∈[+1283,+1413]mm · 10 of 32 slices shown, 13 images]
[im 3/32  brain]
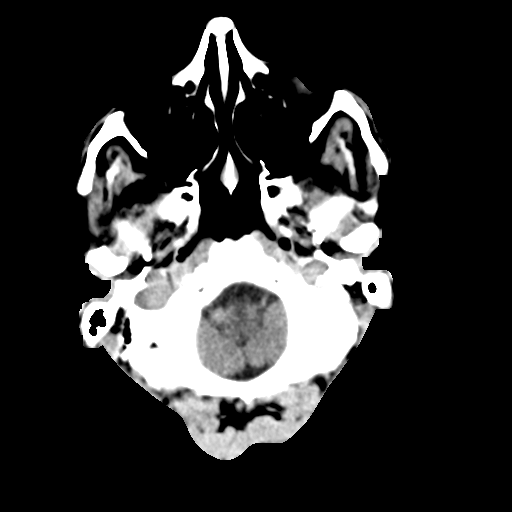
[im 3/32  bone]
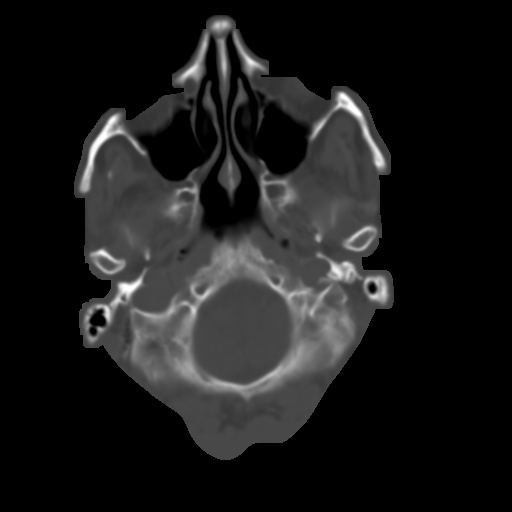
[im 6/32  brain]
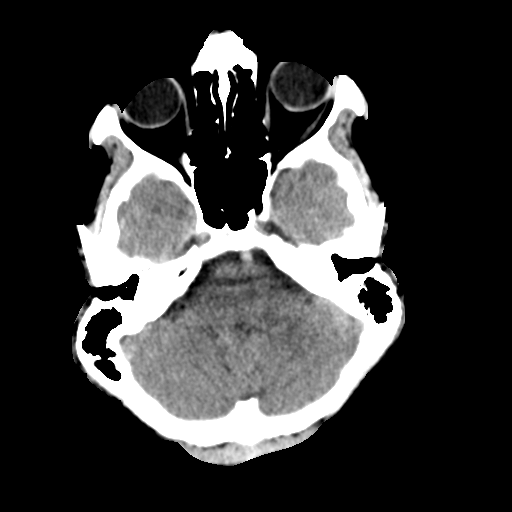
[im 9/32  brain]
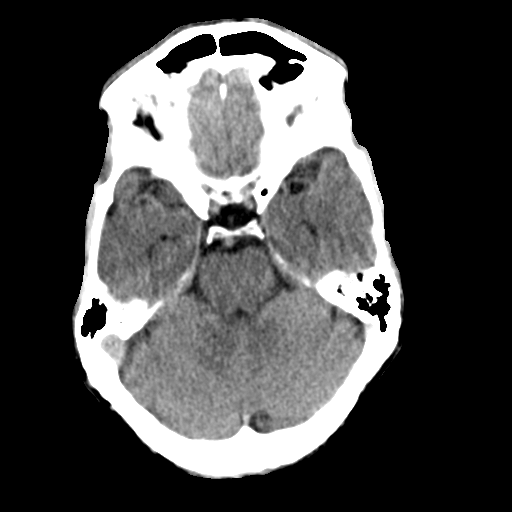
[im 11/32  brain]
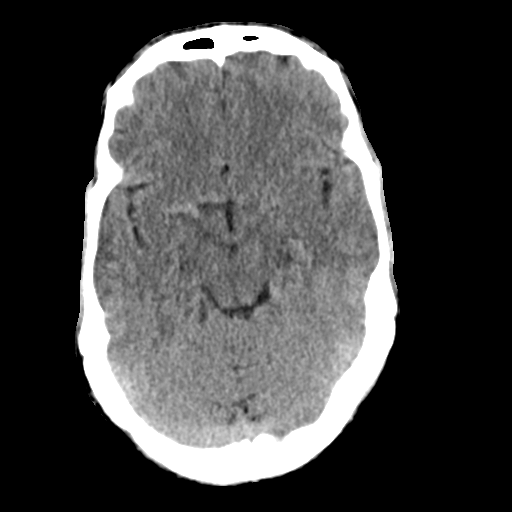
[im 14/32  brain]
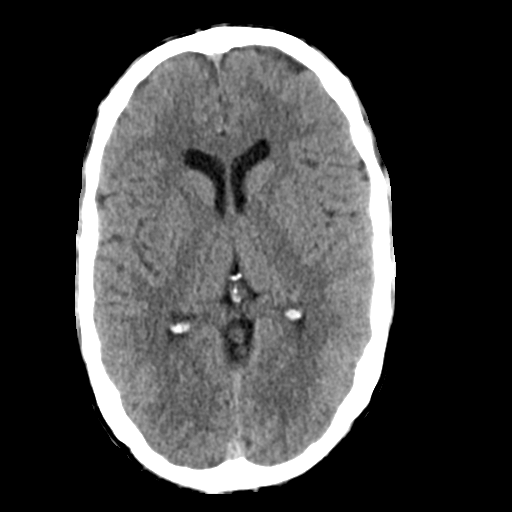
[im 14/32  bone]
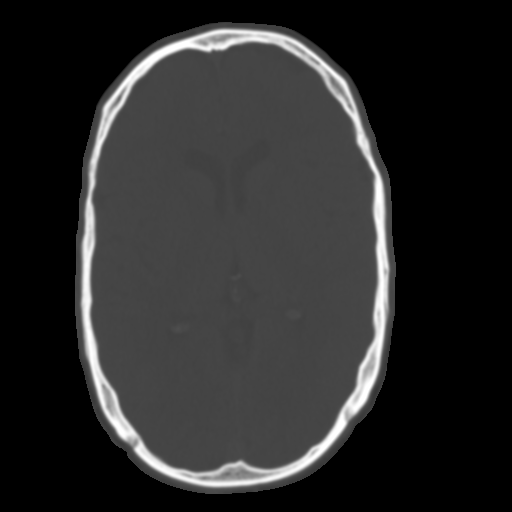
[im 18/32  brain]
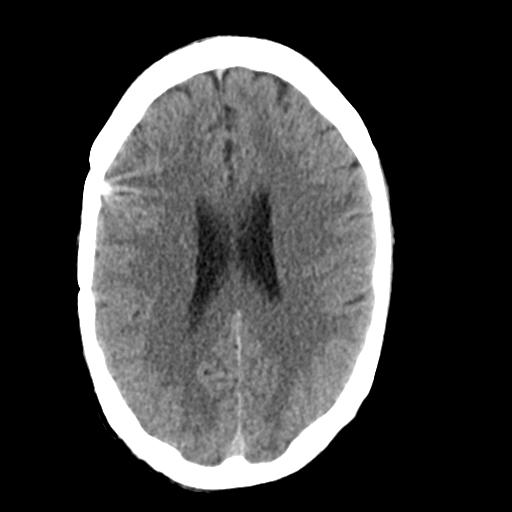
[im 21/32  brain]
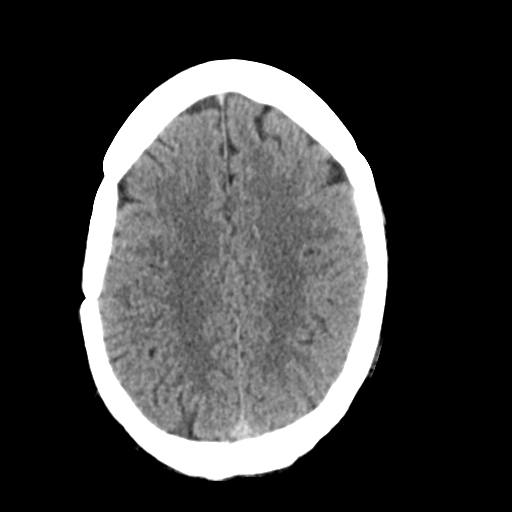
[im 24/32  brain]
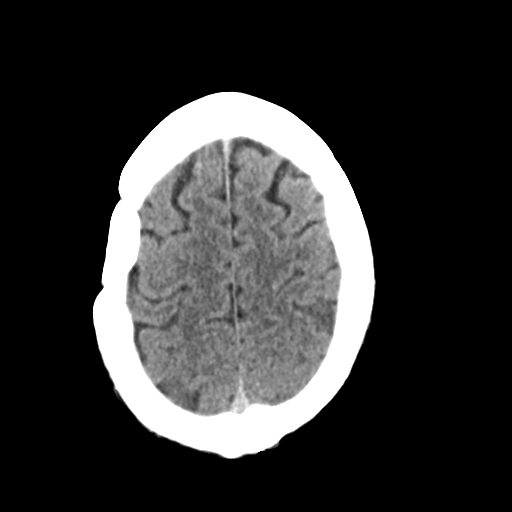
[im 26/32  brain]
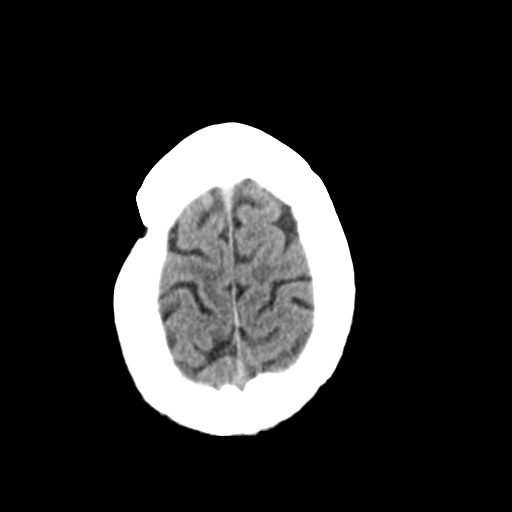
[im 26/32  bone]
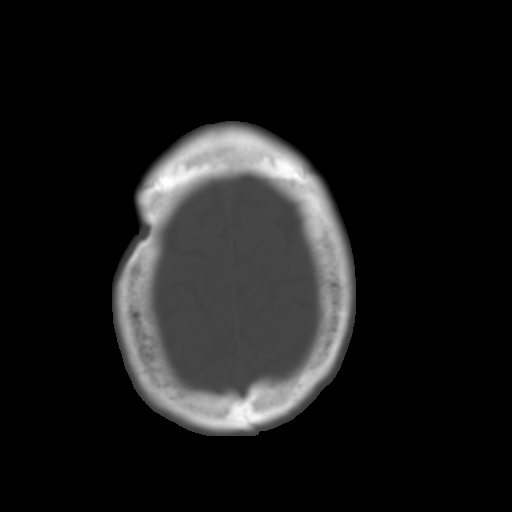
[im 29/32  brain]
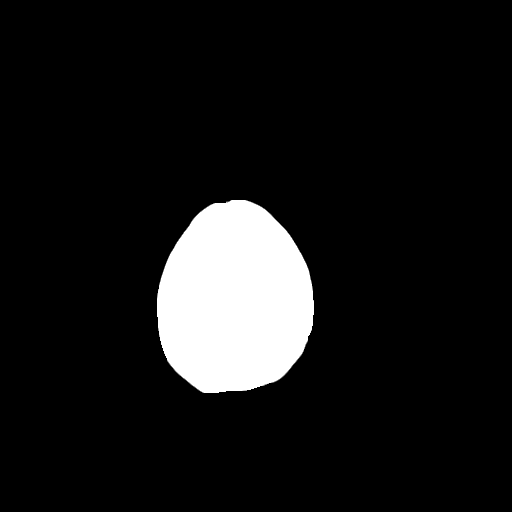

[Series 4: coronal soft tissue · coronal · 0.30mm/px · 3 of 68 slices shown]
[im 23/68  brain]
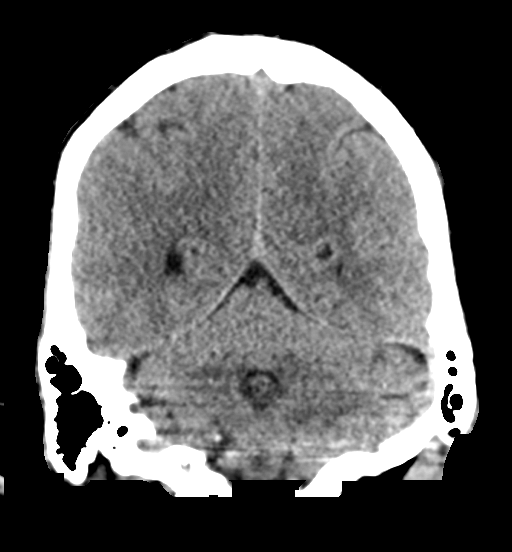
[im 30/68  brain]
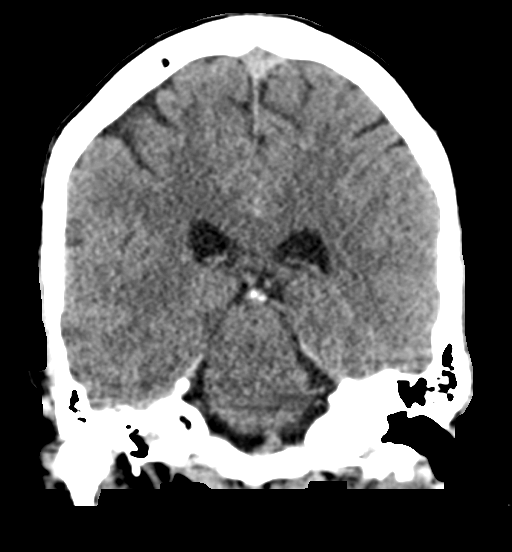
[im 38/68  brain]
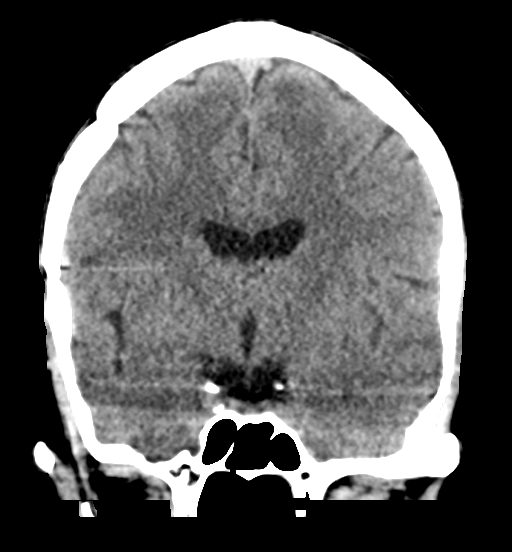

[Series 5: sagittal soft tissue · sagittal · 0.33mm/px · 3 of 52 slices shown]
[im 18/52  brain]
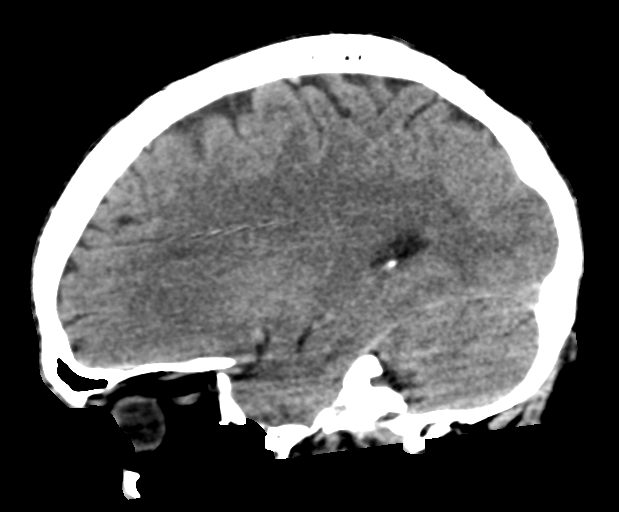
[im 26/52  brain]
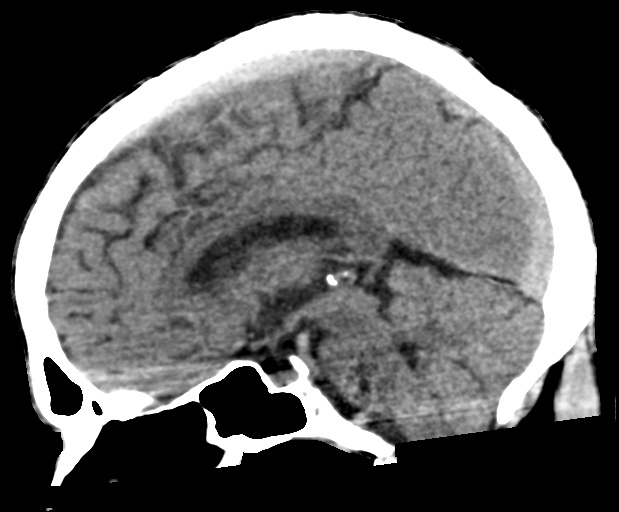
[im 35/52  brain]
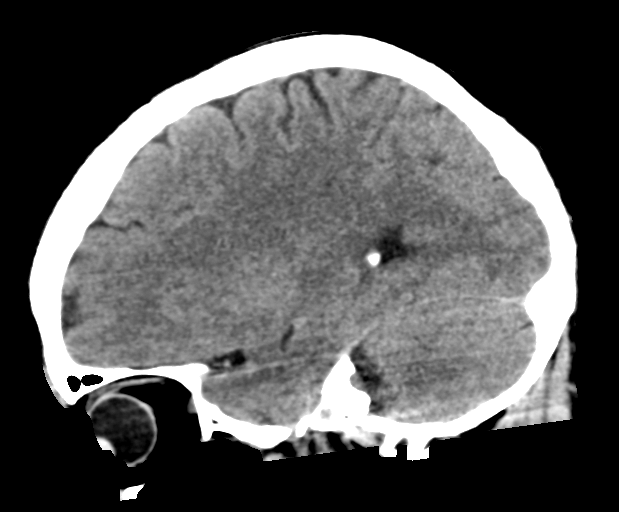

[16 of 47 positions shown; findings below may reference images not displayed]

FINDINGS: Brain: No evidence of acute infarction, hemorrhage, hydrocephalus,
extra-axial collection or mass lesion/mass effect.

Vascular: No hyperdense vessel or unexpected calcification.

Skull: Status post right frontoparietal craniotomy. Negative for
fracture or focal lesion.

Sinuses/Orbits: No acute finding.

Other: None.
IMPRESSION: 1.  No acute intracranial pathology.

2.  Status post right frontoparietal craniotomy.

## 2022-05-30 ENCOUNTER — Ambulatory Visit: Payer: Self-pay | Admitting: Family Medicine
